# Patient Record
Sex: Female | Born: 1995
Health system: Southern US, Community
[De-identification: ages and names within clinical notes are randomized; demographics above are authoritative.]

## PROBLEM LIST (undated history)

## (undated) DIAGNOSIS — R569 Unspecified convulsions: Secondary | ICD-10-CM

---
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
## 2014-12-13 ENCOUNTER — Emergency Department (HOSPITAL_BASED_OUTPATIENT_CLINIC_OR_DEPARTMENT_OTHER)
Admission: EM | Admit: 2014-12-13 | Discharge: 2014-12-13 | Disposition: A | Discharge status: Home / Self Care | Payer: 59 | Attending: Emergency Medicine | Admitting: Emergency Medicine

## 2014-12-13 ENCOUNTER — Encounter (HOSPITAL_BASED_OUTPATIENT_CLINIC_OR_DEPARTMENT_OTHER): Payer: Self-pay | Admitting: Emergency Medicine

## 2014-12-13 DIAGNOSIS — Z792 Long term (current) use of antibiotics: Secondary | ICD-10-CM | POA: Diagnosis not present

## 2014-12-13 DIAGNOSIS — J111 Influenza due to unidentified influenza virus with other respiratory manifestations: Secondary | ICD-10-CM | POA: Insufficient documentation

## 2014-12-13 DIAGNOSIS — Z3202 Encounter for pregnancy test, result negative: Secondary | ICD-10-CM | POA: Diagnosis not present

## 2014-12-13 DIAGNOSIS — R197 Diarrhea, unspecified: Secondary | ICD-10-CM | POA: Diagnosis not present

## 2014-12-13 DIAGNOSIS — Z79899 Other long term (current) drug therapy: Secondary | ICD-10-CM | POA: Diagnosis not present

## 2014-12-13 DIAGNOSIS — R69 Illness, unspecified: Secondary | ICD-10-CM

## 2014-12-13 DIAGNOSIS — R112 Nausea with vomiting, unspecified: Secondary | ICD-10-CM | POA: Diagnosis present

## 2014-12-13 DIAGNOSIS — Z793 Long term (current) use of hormonal contraceptives: Secondary | ICD-10-CM | POA: Diagnosis not present

## 2014-12-13 LAB — URINALYSIS, ROUTINE W REFLEX MICROSCOPIC
BILIRUBIN URINE: NEGATIVE
GLUCOSE, UA: NEGATIVE mg/dL
KETONES UR: 40 mg/dL — AB
Leukocytes, UA: NEGATIVE
NITRITE: NEGATIVE
PH: 6.5 (ref 5.0–8.0)
Protein, ur: NEGATIVE mg/dL
SPECIFIC GRAVITY, URINE: 1.008 (ref 1.005–1.030)
Urobilinogen, UA: 1 mg/dL (ref 0.0–1.0)

## 2014-12-13 LAB — CBC WITH DIFFERENTIAL/PLATELET
BASOS ABS: 0 10*3/uL (ref 0.0–0.1)
Basophils Relative: 0 % (ref 0–1)
Eosinophils Absolute: 0 10*3/uL (ref 0.0–0.7)
Eosinophils Relative: 0 % (ref 0–5)
HEMATOCRIT: 41.1 % (ref 36.0–46.0)
HEMOGLOBIN: 13.8 g/dL (ref 12.0–15.0)
LYMPHS PCT: 7 % — AB (ref 12–46)
Lymphs Abs: 0.4 10*3/uL — ABNORMAL LOW (ref 0.7–4.0)
MCH: 29.4 pg (ref 26.0–34.0)
MCHC: 33.6 g/dL (ref 30.0–36.0)
MCV: 87.6 fL (ref 78.0–100.0)
MONO ABS: 0.5 10*3/uL (ref 0.1–1.0)
MONOS PCT: 8 % (ref 3–12)
NEUTROS ABS: 5.3 10*3/uL (ref 1.7–7.7)
NEUTROS PCT: 85 % — AB (ref 43–77)
Platelets: 147 10*3/uL — ABNORMAL LOW (ref 150–400)
RBC: 4.69 MIL/uL (ref 3.87–5.11)
RDW: 13.2 % (ref 11.5–15.5)
WBC: 6.2 10*3/uL (ref 4.0–10.5)

## 2014-12-13 LAB — COMPREHENSIVE METABOLIC PANEL
ALBUMIN: 4.7 g/dL (ref 3.5–5.2)
ALK PHOS: 53 U/L (ref 39–117)
ALT: 58 U/L — AB (ref 0–35)
ANION GAP: 12 (ref 5–15)
AST: 86 U/L — AB (ref 0–37)
BILIRUBIN TOTAL: 0.3 mg/dL (ref 0.3–1.2)
BUN: 8 mg/dL (ref 6–23)
CHLORIDE: 99 mmol/L (ref 96–112)
CO2: 23 mmol/L (ref 19–32)
Calcium: 9 mg/dL (ref 8.4–10.5)
Creatinine, Ser: 0.62 mg/dL (ref 0.50–1.10)
GFR calc Af Amer: >90 mL/min (ref >90)
GFR calc non Af Amer: >90 mL/min (ref >90)
Glucose, Bld: 98 mg/dL (ref 70–99)
POTASSIUM: 3.5 mmol/L (ref 3.5–5.1)
SODIUM: 134 mmol/L — AB (ref 135–145)
TOTAL PROTEIN: 7.7 g/dL (ref 6.0–8.3)

## 2014-12-13 LAB — URINE MICROSCOPIC-ADD ON

## 2014-12-13 LAB — LIPASE, BLOOD: LIPASE: 22 U/L (ref 11–59)

## 2014-12-13 LAB — PREGNANCY, URINE: Preg Test, Ur: NEGATIVE

## 2014-12-13 MED ORDER — ONDANSETRON HCL 4 MG/2ML IJ SOLN
4.0000 mg | Freq: Once | INTRAMUSCULAR | Status: AC
  Administered 2014-12-13: 4 mg via INTRAVENOUS
  Filled 2014-12-13: qty 2

## 2014-12-13 MED ORDER — KETOROLAC TROMETHAMINE 30 MG/ML IJ SOLN
30.0000 mg | Freq: Once | INTRAMUSCULAR | Status: AC
  Administered 2014-12-13: 30 mg via INTRAVENOUS
  Filled 2014-12-13: qty 1

## 2014-12-13 MED ORDER — ONDANSETRON 4 MG PO TBDP: ORAL_TABLET | ORAL | Status: AC

## 2014-12-13 MED ORDER — SODIUM CHLORIDE 0.9 % IV BOLUS (SEPSIS)
1000.0000 mL | Freq: Once | INTRAVENOUS | Status: AC
  Administered 2014-12-13: 1000 mL via INTRAVENOUS

## 2014-12-13 MED ORDER — SODIUM CHLORIDE 0.9 % IV SOLN
1000.0000 mL | Freq: Once | INTRAVENOUS | Status: AC
  Administered 2014-12-13: 1000 mL via INTRAVENOUS

## 2014-12-13 NOTE — Discharge Instructions (Signed)

## 2014-12-13 NOTE — ED Provider Notes (Signed)
CSN: [id number]     Arrival date & time 12/13/14  2014 History   This chart was scribed for Rolan BuccoMelanie Lakisa Lotz, MD by Gwenyth Oberatherine Macek, ED Scribe. This patient was seen in room MH10/MH10 and the patient's care was started at 9:48 PM.    Chief Complaint  Patient presents with  . Emesis   The history is provided by the patient. No language interpreter was used.    HPI Comments: Jacqueline Stevens is a 19 y.o. female who presents to the Emergency Department complaining of constant, moderate generalized body aches and intermittent vomiting that started yesterday. Pt states rhinorrhea, congestion, cough, diarrhea, fever measuring 102 at its highest, nausea, HA and light-headedness as associated symptoms. She was evaluated by her PCP earlier today who prescribed Zofran. Pt has also tried Tylenol with no relief. Pt denies sick contact. She also denies dysuria and SOB as associated symptoms.  [facility]  Past Medical History  Diagnosis Date  . Seizures    History reviewed. No pertinent past surgical history. History reviewed. No pertinent family history. History  Substance Use Topics  . Smoking status: Never Smoker   . Smokeless tobacco: Not on file  . Alcohol Use: No   OB History    No data available     Review of Systems  Constitutional: Positive for fever. Negative for chills, diaphoresis and fatigue.  HENT: Positive for congestion and rhinorrhea. Negative for sneezing.   Eyes: Negative.   Respiratory: Positive for cough. Negative for chest tightness and shortness of breath.   Cardiovascular: Negative for chest pain and leg swelling.  Gastrointestinal: Positive for nausea, vomiting and diarrhea. Negative for abdominal pain and blood in stool.  Genitourinary: Negative for dysuria, frequency, hematuria, flank pain and difficulty urinating.  Musculoskeletal: Positive for myalgias. Negative for back pain and arthralgias.  Skin: Negative for rash.  Neurological: Positive for  light-headedness and headaches. Negative for dizziness, speech difficulty, weakness and numbness.    Allergies  Other  Home Medications   Prior to Admission medications   Medication Sig Start Date End Date Taking? Authorizing Provider  calcium carbonate (TUMS - DOSED IN MG ELEMENTAL CALCIUM) 500 MG chewable tablet Chew 1 tablet by mouth once as needed. For upset stomach    Historical Provider, MD  doxycycline (VIBRAMYCIN) 100 MG capsule Take 100 mg by mouth 2 (two) times daily.    Historical Provider, MD  medroxyPROGESTERone (DEPO-PROVERA) 150 MG/ML injection Inject 150 mg into the muscle every 3 (three) months.    Historical Provider, MD  metroNIDAZOLE (FLAGYL) 500 MG tablet Take 500 mg by mouth 3 (three) times daily.    Historical Provider, MD  nitrofurantoin, macrocrystal-monohydrate, (MACROBID) 100 MG capsule Take 1 capsule (100 mg total) by mouth 2 (two) times daily. 05/25/13   Rolland PorterMark James, MD  omeprazole (PRILOSEC) 20 MG capsule Take 1 capsule (20 mg total) by mouth daily. 03/09/12 03/09/13  April Palumbo, MD  ondansetron (ZOFRAN ODT) 4 MG disintegrating tablet 4mg  ODT q4 hours prn nausea/vomit 12/13/14   Rolan BuccoMelanie Ajeet Casasola, MD   BP 112/66 mmHg  Pulse 114  Temp(Src) 99.6 F (37.6 C) (Oral)  Resp 18  Ht 5' (1.524 m)  Wt 117 lb (53.071 kg)  BMI 22.85 kg/m2  SpO2 98%  LMP 12/04/2014 Physical Exam  Constitutional: She is oriented to person, place, and time. She appears well-developed and well-nourished.  HENT:  Head: Normocephalic and atraumatic.  Eyes: Pupils are equal, round, and reactive to light.  Neck: Normal range of motion.  Neck supple.  No meningismus  Cardiovascular: Normal rate, regular rhythm and normal heart sounds.   Pulmonary/Chest: Effort normal and breath sounds normal. No respiratory distress. She has no wheezes. She has no rales. She exhibits no tenderness.  Abdominal: Soft. Bowel sounds are normal. There is no tenderness. There is no rebound and no guarding.   Musculoskeletal: Normal range of motion. She exhibits no edema.  Lymphadenopathy:    She has no cervical adenopathy.  Neurological: She is alert and oriented to person, place, and time.  Skin: Skin is warm and dry. No rash noted.  Psychiatric: She has a normal mood and affect.    ED Course  Procedures   DIAGNOSTIC STUDIES: Oxygen Saturation is 100% on RA, normal by my interpretation.    COORDINATION OF CARE: 9:52 PM Discussed treatment plan with pt which includes Zofran and nausea medication. Pt agreed to plan.   Labs Review Labs Reviewed  CBC WITH DIFFERENTIAL/PLATELET - Abnormal; Notable for the following:    Platelets 147 (*)    Neutrophils Relative % 85 (*)    Lymphocytes Relative 7 (*)    Lymphs Abs 0.4 (*)    All other components within normal limits  COMPREHENSIVE METABOLIC PANEL - Abnormal; Notable for the following:    Sodium 134 (*)    AST 86 (*)    ALT 58 (*)    All other components within normal limits  URINALYSIS, ROUTINE W REFLEX MICROSCOPIC - Abnormal; Notable for the following:    Hgb urine dipstick TRACE (*)    Ketones, ur 40 (*)    All other components within normal limits  LIPASE, BLOOD  PREGNANCY, URINE  URINE MICROSCOPIC-ADD ON    Imaging Review No results found.   EKG Interpretation None      MDM   Final diagnoses:  Influenza-like illness    Patient presents with flulike symptoms. She had protocol labs ordered from triage which showed a mild elevation in her LFTs which could go along with a viral type syndrome but otherwise her labs are unremarkable. Her urine pregnancy is negative. Her symptoms are consistent with a viral flulike illness. Her abdomen is not significantly tender on exam. She has no symptoms that would be more suggestive of meningitis. She was given IV fluids and Toradol and is feeling markedly better. She was discharged home in good condition and given symptomatic care instructions. She was advised to return if she has  worsening symptoms.  I personally performed the services described in this documentation, which was scribed in my presence.  The recorded information has been reviewed and considered.     Rolan Bucco, MD 12/13/14 [id number]

## 2014-12-13 NOTE — ED Notes (Signed)
Patient was seen and treated at [facility] today. She was given a medication for nausea and it has not helped. She was told by them to come back if she continued to have nausea because she may need fluids.

## 2018-03-04 DIAGNOSIS — R3915 Urgency of urination: Secondary | ICD-10-CM | POA: Diagnosis not present

## 2018-03-04 DIAGNOSIS — N912 Amenorrhea, unspecified: Secondary | ICD-10-CM | POA: Diagnosis not present

## 2018-03-20 DIAGNOSIS — Z3481 Encounter for supervision of other normal pregnancy, first trimester: Secondary | ICD-10-CM | POA: Diagnosis not present

## 2018-05-07 DIAGNOSIS — Z3481 Encounter for supervision of other normal pregnancy, first trimester: Secondary | ICD-10-CM | POA: Diagnosis not present

## 2018-08-28 DIAGNOSIS — O09293 Supervision of pregnancy with other poor reproductive or obstetric history, third trimester: Secondary | ICD-10-CM | POA: Diagnosis not present

## 2018-08-28 DIAGNOSIS — R7309 Other abnormal glucose: Secondary | ICD-10-CM | POA: Diagnosis not present

## 2018-08-28 DIAGNOSIS — R55 Syncope and collapse: Secondary | ICD-10-CM | POA: Diagnosis not present

## 2018-08-28 DIAGNOSIS — E162 Hypoglycemia, unspecified: Secondary | ICD-10-CM | POA: Diagnosis not present

## 2018-08-28 DIAGNOSIS — E11649 Type 2 diabetes mellitus with hypoglycemia without coma: Secondary | ICD-10-CM | POA: Diagnosis not present

## 2018-08-28 DIAGNOSIS — O34219 Maternal care for unspecified type scar from previous cesarean delivery: Secondary | ICD-10-CM | POA: Diagnosis not present

## 2018-08-28 DIAGNOSIS — E876 Hypokalemia: Secondary | ICD-10-CM | POA: Diagnosis not present

## 2018-08-28 DIAGNOSIS — Z8632 Personal history of gestational diabetes: Secondary | ICD-10-CM | POA: Diagnosis not present

## 2018-08-31 DIAGNOSIS — Z3483 Encounter for supervision of other normal pregnancy, third trimester: Secondary | ICD-10-CM | POA: Diagnosis not present

## 2018-08-31 DIAGNOSIS — Z6711 Type A blood, Rh negative: Secondary | ICD-10-CM | POA: Diagnosis not present

## 2018-10-03 DIAGNOSIS — H9202 Otalgia, left ear: Secondary | ICD-10-CM | POA: Diagnosis not present

## 2018-10-03 DIAGNOSIS — H9192 Unspecified hearing loss, left ear: Secondary | ICD-10-CM | POA: Diagnosis not present

## 2018-10-05 DIAGNOSIS — J069 Acute upper respiratory infection, unspecified: Secondary | ICD-10-CM | POA: Diagnosis not present

## 2018-10-13 DIAGNOSIS — R309 Painful micturition, unspecified: Secondary | ICD-10-CM | POA: Diagnosis not present

## 2018-10-13 DIAGNOSIS — Z7689 Persons encountering health services in other specified circumstances: Secondary | ICD-10-CM | POA: Diagnosis not present

## 2018-10-13 DIAGNOSIS — Z3A35 35 weeks gestation of pregnancy: Secondary | ICD-10-CM | POA: Diagnosis not present

## 2018-10-31 DIAGNOSIS — R202 Paresthesia of skin: Secondary | ICD-10-CM | POA: Diagnosis not present

## 2018-10-31 DIAGNOSIS — M6281 Muscle weakness (generalized): Secondary | ICD-10-CM | POA: Diagnosis not present

## 2018-10-31 DIAGNOSIS — R29898 Other symptoms and signs involving the musculoskeletal system: Secondary | ICD-10-CM | POA: Diagnosis not present

## 2018-11-06 DIAGNOSIS — O26893 Other specified pregnancy related conditions, third trimester: Secondary | ICD-10-CM | POA: Diagnosis not present

## 2018-11-06 DIAGNOSIS — O34219 Maternal care for unspecified type scar from previous cesarean delivery: Secondary | ICD-10-CM | POA: Diagnosis not present

## 2018-11-06 DIAGNOSIS — Z302 Encounter for sterilization: Secondary | ICD-10-CM | POA: Diagnosis not present

## 2018-11-06 DIAGNOSIS — Z3A39 39 weeks gestation of pregnancy: Secondary | ICD-10-CM | POA: Diagnosis not present

## 2018-11-06 DIAGNOSIS — O34211 Maternal care for low transverse scar from previous cesarean delivery: Secondary | ICD-10-CM | POA: Diagnosis not present

## 2018-12-31 DIAGNOSIS — M79641 Pain in right hand: Secondary | ICD-10-CM | POA: Diagnosis not present

## 2018-12-31 DIAGNOSIS — S60221A Contusion of right hand, initial encounter: Secondary | ICD-10-CM | POA: Diagnosis not present

## 2021-08-21 ENCOUNTER — Encounter (HOSPITAL_BASED_OUTPATIENT_CLINIC_OR_DEPARTMENT_OTHER): Payer: Self-pay | Admitting: *Deleted

## 2021-08-21 ENCOUNTER — Emergency Department (HOSPITAL_BASED_OUTPATIENT_CLINIC_OR_DEPARTMENT_OTHER)
Admission: EM | Admit: 2021-08-21 | Discharge: 2021-08-21 | Disposition: A | Discharge status: Home / Self Care | Payer: Medicaid Other | Attending: Emergency Medicine | Admitting: Emergency Medicine

## 2021-08-21 ENCOUNTER — Other Ambulatory Visit: Payer: Self-pay

## 2021-08-21 ENCOUNTER — Other Ambulatory Visit (HOSPITAL_BASED_OUTPATIENT_CLINIC_OR_DEPARTMENT_OTHER): Payer: Self-pay

## 2021-08-21 DIAGNOSIS — H9203 Otalgia, bilateral: Secondary | ICD-10-CM | POA: Insufficient documentation

## 2021-08-21 DIAGNOSIS — R000 Tachycardia, unspecified: Secondary | ICD-10-CM | POA: Diagnosis not present

## 2021-08-21 DIAGNOSIS — H6690 Otitis media, unspecified, unspecified ear: Secondary | ICD-10-CM

## 2021-08-21 MED ORDER — NAPROXEN 375 MG PO TABS
375.0000 mg | ORAL_TABLET | Freq: Two times a day (BID) | ORAL | 0 refills | Status: AC
  Filled 2021-08-21: qty 20, 10d supply, fill #0

## 2021-08-21 MED ORDER — AMOXICILLIN-POT CLAVULANATE 875-125 MG PO TABS
1.0000 | ORAL_TABLET | Freq: Two times a day (BID) | ORAL | 0 refills | Status: AC
  Filled 2021-08-21: qty 14, 7d supply, fill #0

## 2021-08-21 NOTE — Discharge Instructions (Signed)
Take naproxen twice daily with food and water.  This is an anti-inflammatory that should help alleviate the pain.  Continue taking Tylenol.  Take Augmentin twice daily for the next 7 days. Call the ENT physician for follow-up, try make an appointment for late this week or early next week.  If the ear pain does not improve the next 48 hours or if it worsens return to urgent care for ear recheck.

## 2021-08-21 NOTE — ED Provider Notes (Signed)
[facility] EMERGENCY DEPARTMENT Provider Note   CSN: [id number] Arrival date & time: 08/21/21  1407     History Chief Complaint  Patient presents with   Otalgia    Jacqueline Stevens is a 25 y.o. female.  HPI  Patient presents with bilateral ear pain.  Started 3 days ago, associated with mild hearing loss.  The pain itself is constant, she has tried Tylenol and Motrin which is helped somewhat but not significantly.  Denies any fevers at home, no drainage from the ear canal.  She does report bloody drainage from the ears bilaterally that started last night.  Denies any external trauma to the ears, does not use Q-tips.  Not having any associated fever, headache, nasal congestion, postnasal drip, cough, shortness of breath.  Past Medical History:  Diagnosis Date   Seizures (HCC)     There are no problems to display for this patient.   Past Surgical History:  Procedure Laterality Date   CESAREAN SECTION       OB History   No obstetric history on file.     No family history on file.  Social History   Tobacco Use   Smoking status: Never  Substance Use Topics   Alcohol use: No   Drug use: No    Home Medications Prior to Admission medications   Medication Sig Start Date End Date Taking? Authorizing Provider  calcium carbonate (TUMS - DOSED IN MG ELEMENTAL CALCIUM) 500 MG chewable tablet Chew 1 tablet by mouth once as needed. For upset stomach    [provider]  doxycycline (VIBRAMYCIN) 100 MG capsule Take 100 mg by mouth 2 (two) times daily.    [provider]  medroxyPROGESTERone (DEPO-PROVERA) 150 MG/ML injection Inject 150 mg into the muscle every 3 (three) months.    [provider]  metroNIDAZOLE (FLAGYL) 500 MG tablet Take 500 mg by mouth 3 (three) times daily.    [provider]  nitrofurantoin, macrocrystal-monohydrate, (MACROBID) 100 MG capsule Take 1 capsule (100 mg total) by mouth 2 (two) times daily. 05/25/13    Rolland Porter, MD  omeprazole (PRILOSEC) 20 MG capsule Take 1 capsule (20 mg total) by mouth daily. 03/09/12 03/09/13  Palumbo, April, MD  ondansetron (ZOFRAN ODT) 4 MG disintegrating tablet 4mg  ODT q4 hours prn nausea/vomit 12/13/14   02/12/15, MD    Allergies    Other  Review of Systems   Review of Systems  Constitutional:  Negative for fever.  HENT:  Positive for ear discharge and ear pain. Negative for congestion, postnasal drip, sinus pain, tinnitus, trouble swallowing and voice change.   Respiratory:  Negative for cough and shortness of breath.   Cardiovascular:  Negative for chest pain.  Gastrointestinal:  Negative for nausea and vomiting.   Physical Exam Updated Vital Signs BP 120/67    Pulse (!) 105    Temp 99.7 F (37.6 C) (Oral)    Resp 20    Ht 5' (1.524 m)    Wt 54.4 kg    LMP 07/24/2021    SpO2 98%    BMI 23.44 kg/m   Physical Exam Vitals and nursing note reviewed. Exam conducted with a chaperone present.  Constitutional:      General: She is not in acute distress.    Appearance: Normal appearance.  HENT:     Head: Normocephalic and atraumatic.     Right Ear: External ear normal. Tenderness present. No drainage. No hemotympanum. Tympanic membrane is erythematous and bulging.  Tympanic membrane is not perforated.     Left Ear: External ear normal. Tenderness present. No drainage. No hemotympanum. Tympanic membrane is erythematous and bulging. Tympanic membrane is not perforated.     Ears:     Comments: Scant traces of crusted blood to the left ear canal. Eyes:     General: No scleral icterus.    Extraocular Movements: Extraocular movements intact.     Pupils: Pupils are equal, round, and reactive to light.  Cardiovascular:     Rate and Rhythm: Regular rhythm. Tachycardia present.  Pulmonary:     Effort: Pulmonary effort is normal.     Comments: Lungs clear to auscultation bilaterally Skin:    Coloration: Skin is not jaundiced.  Neurological:     Mental Status:  She is alert. Mental status is at baseline.     Coordination: Coordination normal.   ED Results / Procedures / Treatments   Labs (all labs ordered are listed, but only abnormal results are displayed) Labs Reviewed - No data to display  EKG None  Radiology No results found.  Procedures Procedures   Medications Ordered in ED Medications - No data to display  ED Course  I have reviewed the triage vital signs and the nursing notes.  Pertinent labs & imaging results that were available during my care of the patient were reviewed by me and considered in my medical decision making (see chart for details).    MDM Rules/Calculators/A&P                           Patient tachycardic on intake, resolved.  No tachypnea or hypoxia, not having any chest pain or shortness of breath.  Patient has noted a temperature but is not febrile.  Hearing may be slightly reduced but she has decent hearing on exam.  Findings are consistent with AOM, not consistent with necrotizing otitis externa.  No tragal tenderness, she sounds up-to-date on vaccines so mastoiditis unlikely.  Will start on antibiotics and recommended ENT follow-up.  Patient discharged in stable condition.  Discussed HPI, physical exam and plan of care for this patient with attending Marianna Fuss. The attending physician evaluated this patient as part of a shared visit and agrees with plan of care.   Final Clinical Impression(s) / ED Diagnoses Final diagnoses:  None    Rx / DC Orders ED Discharge Orders     None        Theron Arista, [place] 08/21/21 1539    Milagros Loll, MD 08/22/21 [phone]

## 2021-08-21 NOTE — ED Triage Notes (Signed)
Ear pain x 3 days.
